# Patient Record
Sex: Male | Born: 1965 | Race: White | Hispanic: No | Marital: Single | Smoking: Current some day smoker
Health system: Southern US, Community
[De-identification: ages and names within clinical notes are randomized; demographics above are authoritative.]

## PROBLEM LIST (undated history)

## (undated) DIAGNOSIS — G8929 Other chronic pain: Secondary | ICD-10-CM

---

## 2004-08-15 ENCOUNTER — Emergency Department (HOSPITAL_COMMUNITY): Admission: EM | Admit: 2004-08-15 | Discharge: 2004-08-15 | Payer: Self-pay | Admitting: Emergency Medicine

## 2005-04-16 ENCOUNTER — Emergency Department: Payer: Self-pay | Admitting: Emergency Medicine

## 2005-04-20 ENCOUNTER — Emergency Department: Payer: Self-pay | Admitting: Emergency Medicine

## 2005-05-03 ENCOUNTER — Ambulatory Visit: Payer: Self-pay | Admitting: Family Medicine

## 2005-07-13 ENCOUNTER — Emergency Department: Payer: Self-pay | Admitting: General Practice

## 2005-12-15 ENCOUNTER — Emergency Department: Payer: Self-pay | Admitting: Emergency Medicine

## 2006-08-16 ENCOUNTER — Emergency Department: Payer: Self-pay | Admitting: Internal Medicine

## 2006-08-16 ENCOUNTER — Other Ambulatory Visit: Payer: Self-pay

## 2006-09-08 ENCOUNTER — Emergency Department: Payer: Self-pay | Admitting: Emergency Medicine

## 2006-09-09 ENCOUNTER — Emergency Department: Payer: Self-pay | Admitting: Emergency Medicine

## 2007-05-14 ENCOUNTER — Emergency Department: Payer: Self-pay | Admitting: Emergency Medicine

## 2007-05-20 ENCOUNTER — Emergency Department: Payer: Self-pay | Admitting: Internal Medicine

## 2007-07-23 ENCOUNTER — Inpatient Hospital Stay: Payer: Self-pay | Admitting: Surgery

## 2007-12-15 ENCOUNTER — Emergency Department: Payer: Self-pay | Admitting: Emergency Medicine

## 2007-12-22 ENCOUNTER — Emergency Department: Payer: Self-pay | Admitting: Emergency Medicine

## 2007-12-24 ENCOUNTER — Emergency Department: Payer: Self-pay | Admitting: Emergency Medicine

## 2009-01-08 ENCOUNTER — Emergency Department: Payer: Self-pay | Admitting: Emergency Medicine

## 2009-02-10 ENCOUNTER — Emergency Department: Payer: Self-pay | Admitting: Emergency Medicine

## 2009-10-05 ENCOUNTER — Emergency Department: Payer: Self-pay | Admitting: Emergency Medicine

## 2010-04-16 ENCOUNTER — Emergency Department: Payer: Self-pay | Admitting: Emergency Medicine

## 2011-04-05 ENCOUNTER — Emergency Department: Payer: Self-pay | Admitting: Internal Medicine

## 2011-11-19 ENCOUNTER — Emergency Department: Payer: Self-pay | Admitting: *Deleted

## 2012-05-02 ENCOUNTER — Other Ambulatory Visit: Payer: Self-pay

## 2013-03-05 ENCOUNTER — Emergency Department: Payer: Self-pay | Admitting: Emergency Medicine

## 2013-03-05 LAB — DRUG SCREEN, URINE

## 2013-03-05 LAB — COMPREHENSIVE METABOLIC PANEL
Albumin: 3.4 g/dL (ref 3.4–5.0)
Anion Gap: 4 — ABNORMAL LOW (ref 7–16)
Bilirubin,Total: 0.5 mg/dL (ref 0.2–1.0)
Calcium, Total: 8.9 mg/dL (ref 8.5–10.1)
Creatinine: 0.83 mg/dL (ref 0.60–1.30)
EGFR (African American): >60
EGFR (Non-African Amer.): >60
Osmolality: 273 (ref 275–301)
SGOT(AST): 39 U/L — ABNORMAL HIGH (ref 15–37)
SGPT (ALT): 49 U/L (ref 12–78)
Sodium: 136 mmol/L (ref 136–145)
Total Protein: 7.6 g/dL (ref 6.4–8.2)

## 2013-03-05 LAB — URINALYSIS, COMPLETE
Bacteria: NEGATIVE
Bilirubin,UR: NEGATIVE
Blood: NEGATIVE
Glucose,UR: NEGATIVE mg/dL (ref 0–75)
Ketone: NEGATIVE
Leukocyte Esterase: NEGATIVE
Nitrite: NEGATIVE
Ph: 8 (ref 4.5–8.0)
Protein: NEGATIVE
Specific Gravity: 1.01 (ref 1.003–1.030)
WBC UR: NONE SEEN /HPF (ref 0–5)

## 2013-03-05 LAB — CBC
HCT: 44.1 % (ref 40.0–52.0)
HGB: 14.9 g/dL (ref 13.0–18.0)
MCH: 30.1 pg (ref 26.0–34.0)
MCHC: 33.7 g/dL (ref 32.0–36.0)
MCV: 89 fL (ref 80–100)
Platelet: 259 10*3/uL (ref 150–440)
RBC: 4.94 10*6/uL (ref 4.40–5.90)
RDW: 13.2 % (ref 11.5–14.5)
WBC: 10 10*3/uL (ref 3.8–10.6)

## 2013-03-05 LAB — ETHANOL
Ethanol %: <0.003 % (ref 0.000–0.080)
Ethanol: <3 mg/dL

## 2013-03-05 LAB — ACETAMINOPHEN LEVEL: Acetaminophen: <2 ug/mL

## 2013-03-05 LAB — TSH: Thyroid Stimulating Horm: 0.62 u[IU]/mL

## 2013-03-05 LAB — SALICYLATE LEVEL: Salicylates, Serum: 2.8 mg/dL

## 2013-03-06 ENCOUNTER — Inpatient Hospital Stay: Payer: Self-pay | Admitting: Internal Medicine

## 2013-03-06 LAB — CBC
HCT: 42.2 % (ref 40.0–52.0)
Platelet: 279 10*3/uL (ref 150–440)
RBC: 4.76 10*6/uL (ref 4.40–5.90)
WBC: 9.1 10*3/uL (ref 3.8–10.6)

## 2013-03-06 LAB — COMPREHENSIVE METABOLIC PANEL
Anion Gap: 6 — ABNORMAL LOW (ref 7–16)
BUN: 13 mg/dL (ref 7–18)
Calcium, Total: 8.8 mg/dL (ref 8.5–10.1)
Creatinine: 0.94 mg/dL (ref 0.60–1.30)
EGFR (African American): >60
Glucose: 130 mg/dL — ABNORMAL HIGH (ref 65–99)
SGOT(AST): 39 U/L — ABNORMAL HIGH (ref 15–37)
SGPT (ALT): 50 U/L (ref 12–78)
Sodium: 138 mmol/L (ref 136–145)
Total Protein: 7.9 g/dL (ref 6.4–8.2)

## 2013-03-06 LAB — URINALYSIS, COMPLETE
Bilirubin,UR: NEGATIVE
Glucose,UR: NEGATIVE mg/dL (ref 0–75)
Ph: 6 (ref 4.5–8.0)
Protein: NEGATIVE
RBC,UR: 1 /HPF (ref 0–5)

## 2013-03-06 LAB — DRUG SCREEN, URINE
Amphetamines, Ur Screen: NEGATIVE (ref ?–1000)
Barbiturates, Ur Screen: NEGATIVE (ref ?–200)
Methadone, Ur Screen: NEGATIVE (ref ?–300)
Opiate, Ur Screen: POSITIVE (ref ?–300)

## 2013-03-06 LAB — ETHANOL: Ethanol: <3 mg/dL

## 2013-03-07 LAB — BASIC METABOLIC PANEL
Anion Gap: 8 (ref 7–16)
Chloride: 106 mmol/L (ref 98–107)
EGFR (Non-African Amer.): >60
Potassium: 3.6 mmol/L (ref 3.5–5.1)
Sodium: 138 mmol/L (ref 136–145)

## 2013-03-07 LAB — CBC WITH DIFFERENTIAL/PLATELET
Eosinophil #: 0.2 10*3/uL (ref 0.0–0.7)
Eosinophil %: 1.6 %
HGB: 14.7 g/dL (ref 13.0–18.0)
Lymphocyte #: 1.9 10*3/uL (ref 1.0–3.6)
Lymphocyte %: 16.1 %
MCH: 30.1 pg (ref 26.0–34.0)
MCHC: 34.2 g/dL (ref 32.0–36.0)
MCV: 88 fL (ref 80–100)
Monocyte %: 7.9 %
Neutrophil #: 8.8 10*3/uL — ABNORMAL HIGH (ref 1.4–6.5)
Neutrophil %: 74 %
Platelet: 271 10*3/uL (ref 150–440)
RDW: 13.3 % (ref 11.5–14.5)
WBC: 11.8 10*3/uL — ABNORMAL HIGH (ref 3.8–10.6)

## 2013-03-07 LAB — HEMOGLOBIN A1C: Hemoglobin A1C: 5.7 % (ref 4.2–6.3)

## 2013-03-09 LAB — COMPREHENSIVE METABOLIC PANEL
Albumin: 3.3 g/dL — ABNORMAL LOW (ref 3.4–5.0)
Alkaline Phosphatase: 87 U/L (ref 50–136)
Anion Gap: 9 (ref 7–16)
BUN: 10 mg/dL (ref 7–18)
Bilirubin,Total: 0.5 mg/dL (ref 0.2–1.0)
Calcium, Total: 8.6 mg/dL (ref 8.5–10.1)
Creatinine: 1.01 mg/dL (ref 0.60–1.30)
Glucose: 133 mg/dL — ABNORMAL HIGH (ref 65–99)
Potassium: 3.1 mmol/L — ABNORMAL LOW (ref 3.5–5.1)
SGOT(AST): 28 U/L (ref 15–37)
Total Protein: 7.6 g/dL (ref 6.4–8.2)

## 2013-06-23 ENCOUNTER — Other Ambulatory Visit: Payer: Self-pay | Admitting: *Deleted

## 2013-06-23 NOTE — Telephone Encounter (Signed)
Encounter opened in error

## 2013-08-31 IMAGING — CT CT HEAD WITHOUT CONTRAST
2 series · 15 of 30 positions shown, 19 images · non-contrast
Comparison: none

REASON FOR EXAM: fall down steps, +etoh
COMMENTS:

[Series 2: without · axial · non-contrast · 0.44mm/px · z∈[+214,+354]mm · 13 of 34 slices shown, 17 images]
[im 3/34  brain]
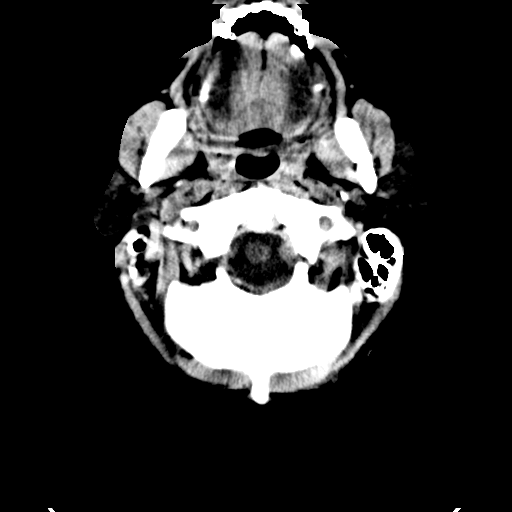
[im 3/34  bone]
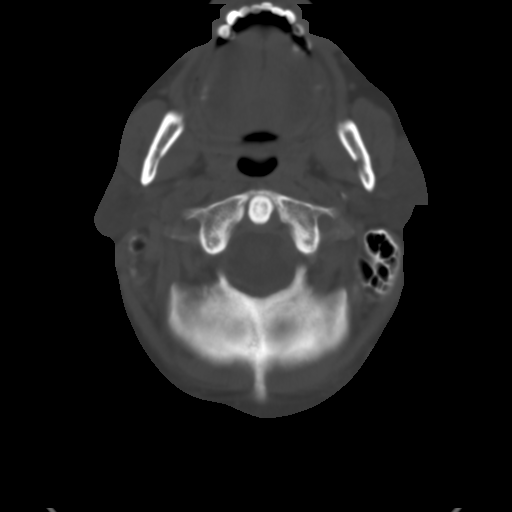
[im 5/34  brain]
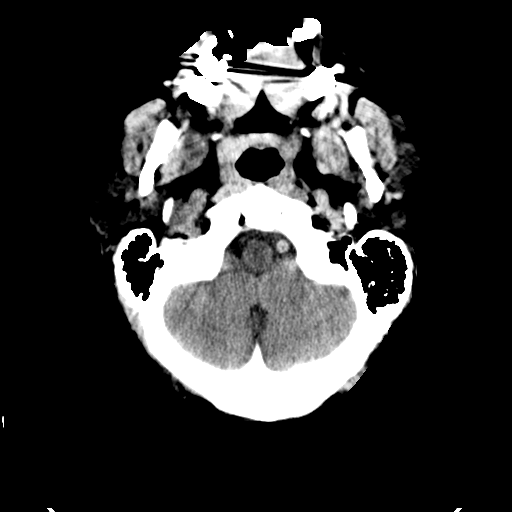
[im 8/34  brain]
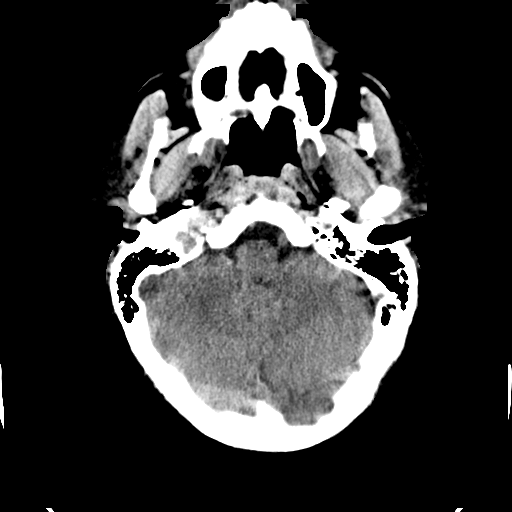
[im 10/34  brain]
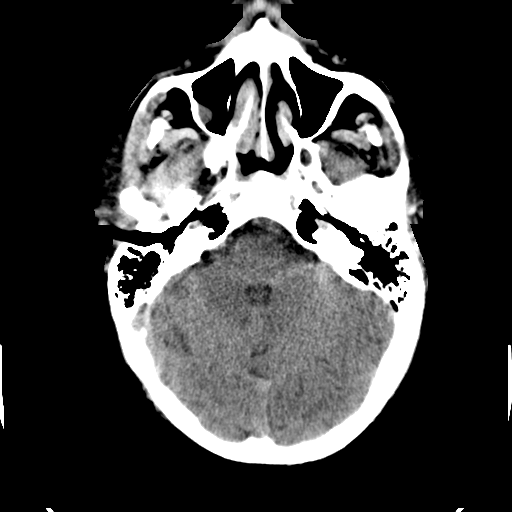
[im 12/34  brain]
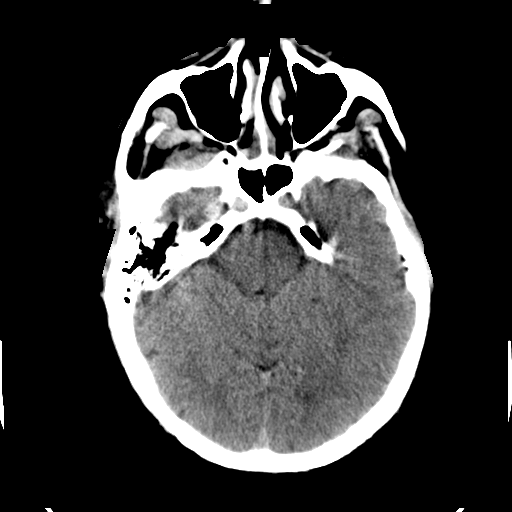
[im 12/34  bone]
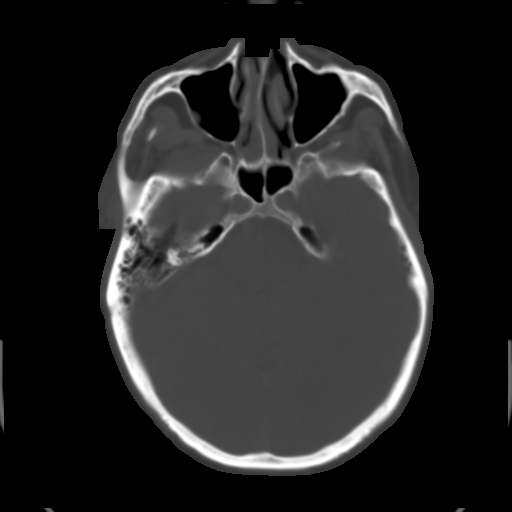
[im 15/34  brain]
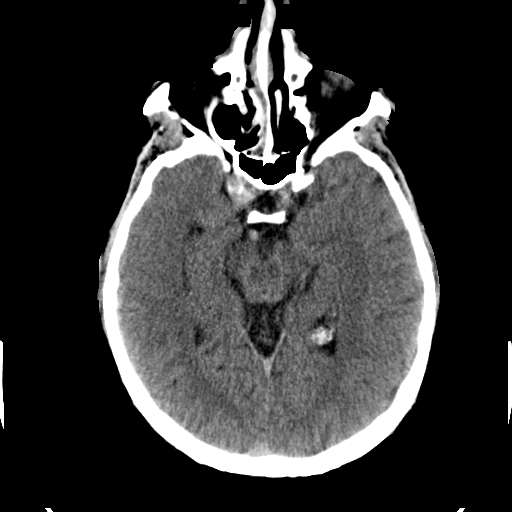
[im 17/34  brain]
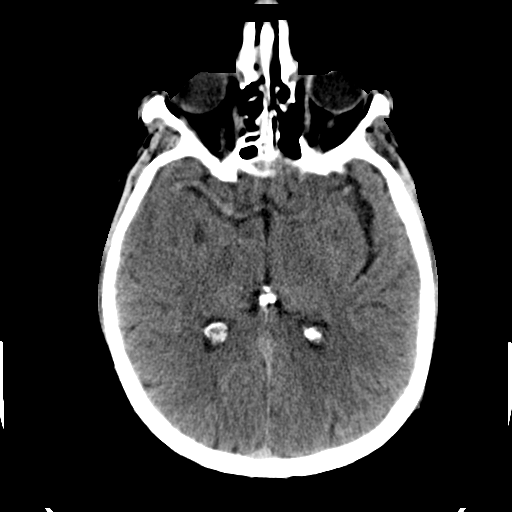
[im 19/34  brain]
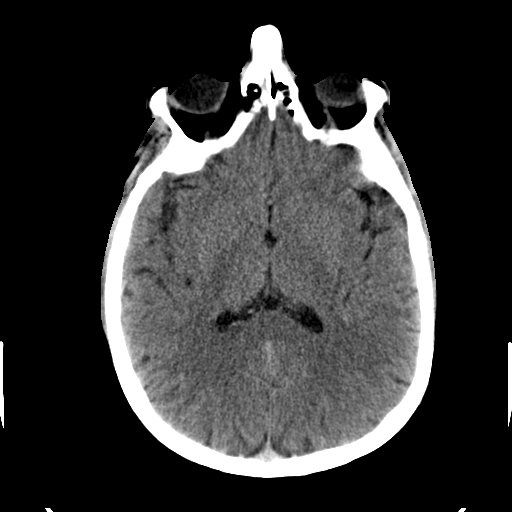
[im 22/34  brain]
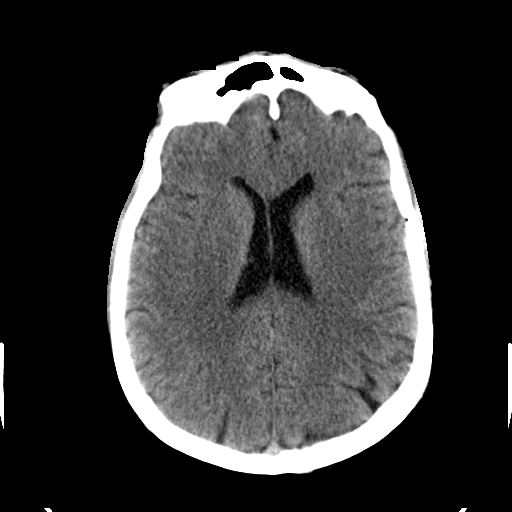
[im 22/34  bone]
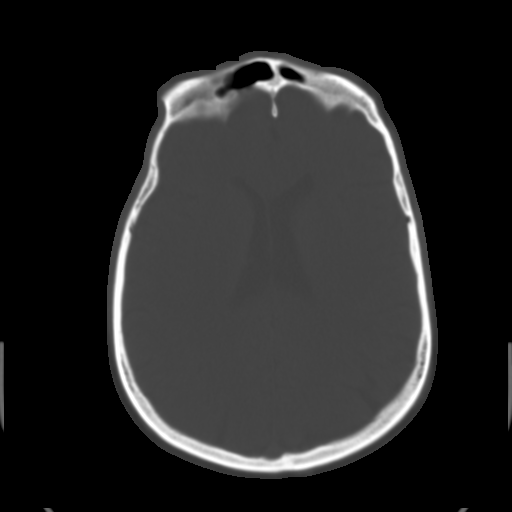
[im 24/34  brain]
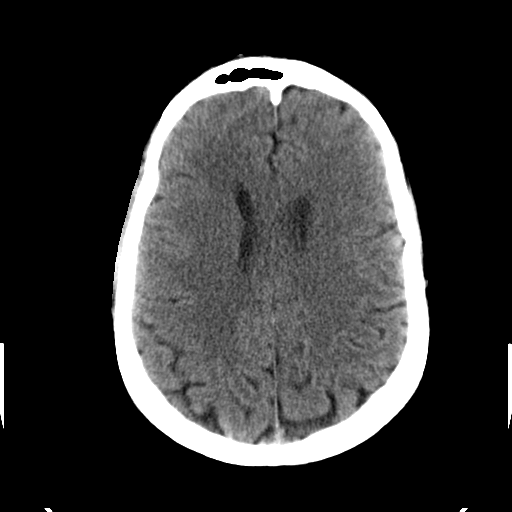
[im 26/34  brain]
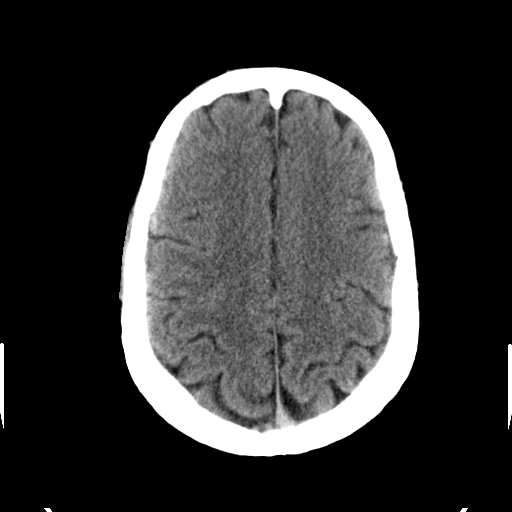
[im 29/34  brain]
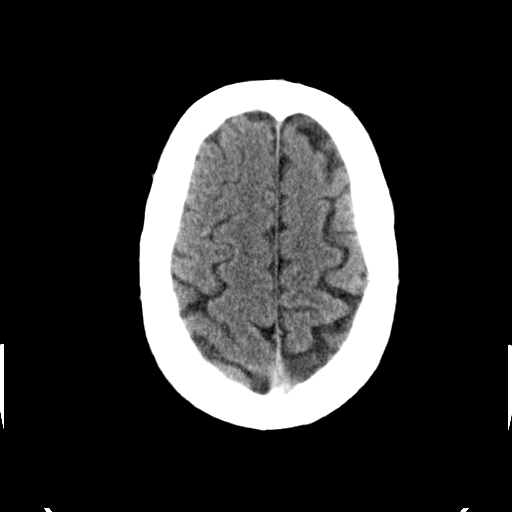
[im 31/34  brain]
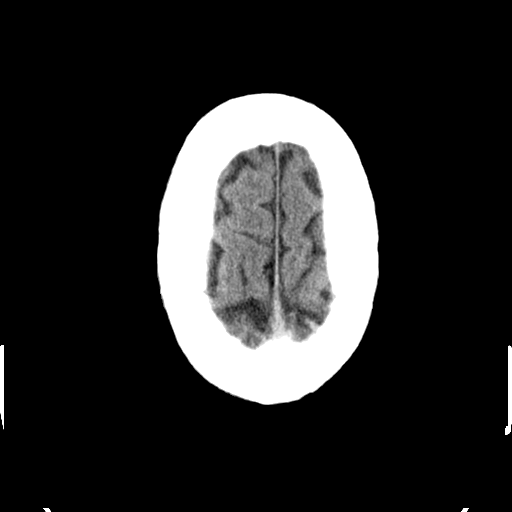
[im 31/34  bone]
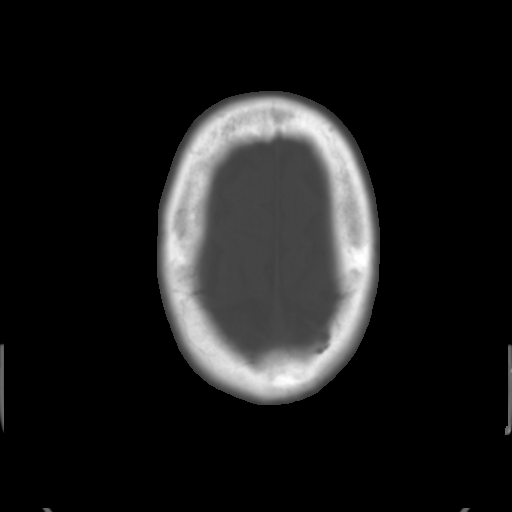

[Series 3: bone · axial · 0.44mm/px · z∈[+214,+240]mm · 2 of 35 slices shown]
[im 3/35  bone]
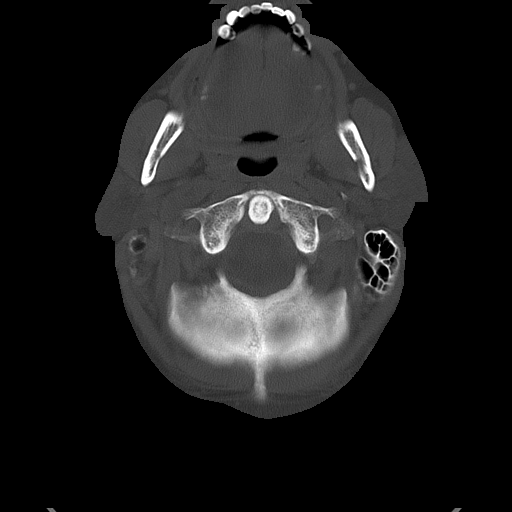
[im 8/35  bone]
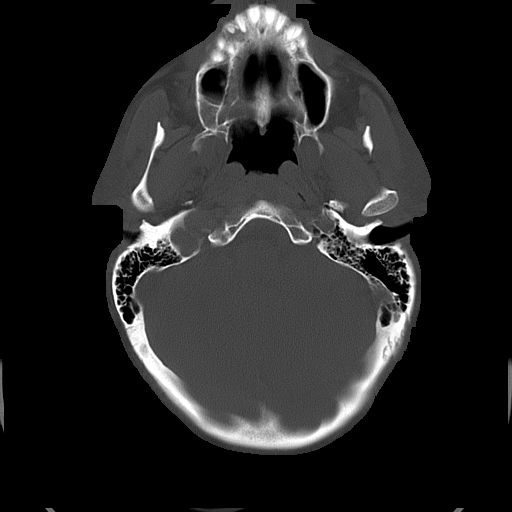

[15 of 30 positions shown; findings below may reference images not displayed]

PROCEDURE:     CT  - CT HEAD WITHOUT CONTRAST  - November 19, 2011 [DATE]

RESULT:     Axial noncontrast CT scanning was performed through the brain
with reconstructions at 5 mm intervals and slice thicknesses.

The ventricles are normal in size and position. There is an approximately 6
by 8mm diameter hypodensity in the be in Ceejay cortex on the right. There is
no intracranial hemorrhage nor intracranial mass effect. The cerebellum and
brainstem are normal in density. At bone window settings there is likely a
retention cyst or polyp in the right maxillary sinus. There is
mucoperiosteal thickening within the ethmoid sinus cells. There is no
evidence of an acute skull fracture.
IMPRESSION: 1. There is no evidence of an acute intracranial hemorrhage nor intracranial
mass effect. There is no evidence of an acute skull fracture.
2. Hypodensity in the basal ganglia on the right could reflect an old
lacunar infarction or a prominent CSF space. I do not see evidence of mass
effect or intracranial hemorrhage. This could be evaluated further with
elective MRI.
3. There is no hydrocephalus.

A preliminary report was sent to the [HOSPITAL] the conclusion
of the study.

## 2013-11-04 ENCOUNTER — Emergency Department: Payer: Self-pay | Admitting: Emergency Medicine

## 2013-11-04 LAB — COMPREHENSIVE METABOLIC PANEL
ALT: 43 U/L (ref 12–78)
ANION GAP: 3 — AB (ref 7–16)
Albumin: 3.6 g/dL (ref 3.4–5.0)
Alkaline Phosphatase: 91 U/L
BILIRUBIN TOTAL: 0.5 mg/dL (ref 0.2–1.0)
BUN: 15 mg/dL (ref 7–18)
CHLORIDE: 100 mmol/L (ref 98–107)
CREATININE: 1 mg/dL (ref 0.60–1.30)
Calcium, Total: 8.8 mg/dL (ref 8.5–10.1)
Co2: 32 mmol/L (ref 21–32)
EGFR (African American): >60
EGFR (Non-African Amer.): >60
GLUCOSE: 115 mg/dL — AB (ref 65–99)
Osmolality: 272 (ref 275–301)
Potassium: 3.9 mmol/L (ref 3.5–5.1)
SGOT(AST): 28 U/L (ref 15–37)
SODIUM: 135 mmol/L — AB (ref 136–145)
Total Protein: 8.3 g/dL — ABNORMAL HIGH (ref 6.4–8.2)

## 2013-11-04 LAB — URINALYSIS, COMPLETE
BILIRUBIN, UR: NEGATIVE
Bacteria: NONE SEEN
GLUCOSE, UR: NEGATIVE mg/dL (ref 0–75)
Ketone: NEGATIVE
LEUKOCYTE ESTERASE: NEGATIVE
NITRITE: NEGATIVE
Ph: 6 (ref 4.5–8.0)
Protein: NEGATIVE
RBC,UR: 6 /HPF (ref 0–5)
SPECIFIC GRAVITY: 1.018 (ref 1.003–1.030)
SQUAMOUS EPITHELIAL: NONE SEEN
WBC UR: NONE SEEN /HPF (ref 0–5)

## 2013-11-04 LAB — CBC
HCT: 39.1 % — AB (ref 40.0–52.0)
HGB: 13.5 g/dL (ref 13.0–18.0)
MCH: 30.7 pg (ref 26.0–34.0)
MCHC: 34.5 g/dL (ref 32.0–36.0)
MCV: 89 fL (ref 80–100)
Platelet: 248 10*3/uL (ref 150–440)
RBC: 4.4 10*6/uL (ref 4.40–5.90)
RDW: 12.9 % (ref 11.5–14.5)
WBC: 9.3 10*3/uL (ref 3.8–10.6)

## 2013-11-04 LAB — DRUG SCREEN, URINE
Amphetamines, Ur Screen: NEGATIVE (ref ?–1000)
BARBITURATES, UR SCREEN: POSITIVE (ref ?–200)
BENZODIAZEPINE, UR SCRN: NEGATIVE (ref ?–200)
Cannabinoid 50 Ng, Ur ~~LOC~~: NEGATIVE (ref ?–50)
Cocaine Metabolite,Ur ~~LOC~~: POSITIVE (ref ?–300)
MDMA (ECSTASY) UR SCREEN: NEGATIVE (ref ?–500)
METHADONE, UR SCREEN: NEGATIVE (ref ?–300)
OPIATE, UR SCREEN: POSITIVE (ref ?–300)
PHENCYCLIDINE (PCP) UR S: NEGATIVE (ref ?–25)
Tricyclic, Ur Screen: NEGATIVE (ref ?–1000)

## 2013-11-04 LAB — ETHANOL: Ethanol %: <0.003 % (ref 0.000–0.080)

## 2013-11-04 LAB — SALICYLATE LEVEL: SALICYLATES, SERUM: 3.1 mg/dL — AB

## 2013-11-04 LAB — ACETAMINOPHEN LEVEL: Acetaminophen: <2 ug/mL

## 2014-01-04 ENCOUNTER — Emergency Department: Payer: Self-pay | Admitting: Emergency Medicine

## 2014-01-04 LAB — COMPREHENSIVE METABOLIC PANEL
ANION GAP: 4 — AB (ref 7–16)
AST: 37 U/L (ref 15–37)
Albumin: 3.6 g/dL (ref 3.4–5.0)
Alkaline Phosphatase: 89 U/L
BILIRUBIN TOTAL: 0.3 mg/dL (ref 0.2–1.0)
BUN: 17 mg/dL (ref 7–18)
CALCIUM: 9 mg/dL (ref 8.5–10.1)
CREATININE: 1.06 mg/dL (ref 0.60–1.30)
Chloride: 102 mmol/L (ref 98–107)
Co2: 29 mmol/L (ref 21–32)
EGFR (African American): >60
GLUCOSE: 102 mg/dL — AB (ref 65–99)
OSMOLALITY: 272 (ref 275–301)
Potassium: 4.6 mmol/L (ref 3.5–5.1)
SGPT (ALT): 47 U/L (ref 12–78)
SODIUM: 135 mmol/L — AB (ref 136–145)
TOTAL PROTEIN: 8.5 g/dL — AB (ref 6.4–8.2)

## 2014-01-04 LAB — URINALYSIS, COMPLETE
Bacteria: NONE SEEN
Bilirubin,UR: NEGATIVE
Blood: NEGATIVE
GLUCOSE, UR: NEGATIVE mg/dL (ref 0–75)
KETONE: NEGATIVE
LEUKOCYTE ESTERASE: NEGATIVE
Nitrite: NEGATIVE
PH: 5 (ref 4.5–8.0)
Protein: NEGATIVE
Specific Gravity: 1.015 (ref 1.003–1.030)
WBC UR: <1 /HPF (ref 0–5)

## 2014-01-04 LAB — DRUG SCREEN, URINE
AMPHETAMINES, UR SCREEN: NEGATIVE (ref ?–1000)
Barbiturates, Ur Screen: NEGATIVE (ref ?–200)
Benzodiazepine, Ur Scrn: NEGATIVE (ref ?–200)
Cannabinoid 50 Ng, Ur ~~LOC~~: NEGATIVE (ref ?–50)
Cocaine Metabolite,Ur ~~LOC~~: NEGATIVE (ref ?–300)
MDMA (Ecstasy)Ur Screen: NEGATIVE (ref ?–500)
Methadone, Ur Screen: NEGATIVE (ref ?–300)
Opiate, Ur Screen: POSITIVE (ref ?–300)
Phencyclidine (PCP) Ur S: NEGATIVE (ref ?–25)
Tricyclic, Ur Screen: NEGATIVE (ref ?–1000)

## 2014-01-04 LAB — CBC
HCT: 40.9 % (ref 40.0–52.0)
HGB: 13.3 g/dL (ref 13.0–18.0)
MCH: 29.1 pg (ref 26.0–34.0)
MCHC: 32.5 g/dL (ref 32.0–36.0)
MCV: 89 fL (ref 80–100)
Platelet: 331 10*3/uL (ref 150–440)
RBC: 4.57 10*6/uL (ref 4.40–5.90)
RDW: 13.3 % (ref 11.5–14.5)
WBC: 7.8 10*3/uL (ref 3.8–10.6)

## 2014-01-04 LAB — ETHANOL

## 2014-01-04 LAB — ACETAMINOPHEN LEVEL

## 2014-01-04 LAB — SALICYLATE LEVEL: Salicylates, Serum: 2.8 mg/dL

## 2014-02-03 ENCOUNTER — Emergency Department: Payer: Self-pay | Admitting: Emergency Medicine

## 2014-02-03 LAB — COMPREHENSIVE METABOLIC PANEL
ALBUMIN: 3.5 g/dL (ref 3.4–5.0)
AST: 41 U/L — AB (ref 15–37)
Alkaline Phosphatase: 94 U/L
Anion Gap: 10 (ref 7–16)
BILIRUBIN TOTAL: 0.6 mg/dL (ref 0.2–1.0)
BUN: 12 mg/dL (ref 7–18)
CALCIUM: 8.4 mg/dL — AB (ref 8.5–10.1)
CO2: 25 mmol/L (ref 21–32)
Chloride: 105 mmol/L (ref 98–107)
Creatinine: 0.92 mg/dL (ref 0.60–1.30)
EGFR (African American): >60
EGFR (Non-African Amer.): >60
Glucose: 107 mg/dL — ABNORMAL HIGH (ref 65–99)
Osmolality: 280 (ref 275–301)
POTASSIUM: 4.3 mmol/L (ref 3.5–5.1)
SGPT (ALT): 53 U/L
SODIUM: 140 mmol/L (ref 136–145)
TOTAL PROTEIN: 8.1 g/dL (ref 6.4–8.2)

## 2014-02-03 LAB — DRUG SCREEN, URINE
Amphetamines, Ur Screen: NEGATIVE (ref ?–1000)
BARBITURATES, UR SCREEN: NEGATIVE (ref ?–200)
BENZODIAZEPINE, UR SCRN: NEGATIVE (ref ?–200)
CANNABINOID 50 NG, UR ~~LOC~~: POSITIVE (ref ?–50)
COCAINE METABOLITE, UR ~~LOC~~: NEGATIVE (ref ?–300)
MDMA (ECSTASY) UR SCREEN: NEGATIVE (ref ?–500)
METHADONE, UR SCREEN: NEGATIVE (ref ?–300)
OPIATE, UR SCREEN: POSITIVE (ref ?–300)
PHENCYCLIDINE (PCP) UR S: NEGATIVE (ref ?–25)
TRICYCLIC, UR SCREEN: NEGATIVE (ref ?–1000)

## 2014-02-03 LAB — CBC
HCT: 42.7 % (ref 40.0–52.0)
HGB: 14.6 g/dL (ref 13.0–18.0)
MCH: 29.6 pg (ref 26.0–34.0)
MCHC: 34.1 g/dL (ref 32.0–36.0)
MCV: 87 fL (ref 80–100)
Platelet: 278 10*3/uL (ref 150–440)
RBC: 4.92 10*6/uL (ref 4.40–5.90)
RDW: 13 % (ref 11.5–14.5)
WBC: 9.2 10*3/uL (ref 3.8–10.6)

## 2014-02-03 LAB — URINALYSIS, COMPLETE
BACTERIA: NONE SEEN
BILIRUBIN, UR: NEGATIVE
Blood: NEGATIVE
GLUCOSE, UR: NEGATIVE mg/dL (ref 0–75)
Ketone: NEGATIVE
LEUKOCYTE ESTERASE: NEGATIVE
NITRITE: NEGATIVE
PH: 8 (ref 4.5–8.0)
Protein: NEGATIVE
Specific Gravity: 1.016 (ref 1.003–1.030)
Squamous Epithelial: NONE SEEN
WBC UR: <1 /HPF (ref 0–5)

## 2014-02-03 LAB — ETHANOL
Ethanol %: <0.003 % (ref 0.000–0.080)
Ethanol: <3 mg/dL

## 2014-10-09 NOTE — Discharge Summary (Signed)
PATIENT NAME:  Chris Briggs, Deveion P MR#:  [id number] DATE OF BIRTH:  1966-01-17  DATE OF ADMISSION:  03/06/2013 DATE OF DISCHARGE:  03/09/2013  DISCHARGE DIAGNOSES: 1.  Delirium tremens, secondary to alcohol abuse.  2.  Alcohol intoxication.  3.  Hypokalemia.  4.  Depression. 5.  Chronic back pain.   CONSULTATION: Psychiatric consultation with Dr. Jennet MaduroPucilowska.  HOSPITAL COURSE: This is a 49 year old male with a history of alcohol abuse, came in because of detox from alcohol. The patient initially was seen on the behavioral side of the ER, received multiple doses of Ativan. Still was very agitated and became very restless. Admitted to medicine for DTs. Admitted to the Intensive Care Unit, started on a Precedex drip.   The patient's lab data was not significant on admission except AST was slightly up. The patient continued on the CIWA protocol and continued on the Precedex drip, monitored in the ICU for  seizures and other symptoms like respiratory depression.   The patient was seen by psychiatry, Dr. Jennet MaduroPucilowska. She recommended continuing CIWA and the patient was not referred to a psych bed because of limited bed availability. The patient came off the DTs and the Precedex drip since yesterday morning, and the patient is awake, alert, oriented. Denies any hallucinations or delusions. No shaking. He does admit that he was taking a fentanyl patch and also Soma for his back pain for a long time, and recently he did not get it for the last 2 months due to lack of insurance. Because of that he started to drink again. The patient says that he will not drink again and is asking me to give him a Duragesic  patch for a few days until he gets it from his primary doctor.   The takes [place]Soma and a Duragesic patch at home, and he takes he takes Soma 50 mg daily at night and Duragesic patch 75 mcg patch every 10 days.   The patient also goes to substance abuse counseling once or twice a week.   Right now he is alert,  awake, oriented. Denies any complaints. Did not take any Haldol or Ativan since yesterday, morning and vitals this morning are a blood pressure of 120/99, pulse is 106, respirations are 18, temperature is 98.4, heart rate was around 70's the whole time yesterday. The patient has a low potassium of 3.1 on this morning's labs. He is going to get 40 of p.o. potassium 40 mEq, and other labs were normal. His LFTs were also normal this morning, ALT 37, AST  28, alkaline phosphatase 87. The patient is stable for his depression. He can continue trazodone. The patient is going home to follow with Dr. Thana AtesMorrisey.   DISCHARGE MEDICATIONS: Ibuprofen 200 mg daily, thiamine 100 mg p.o. daily, folic acid 1 mg p.o. daily, amantadine 100 mg every 12 hours as needed, trazodone 50 mg 1 to 2 tablets daily, Zyprexa 1 tablet every 12 hours as needed, Soma 1 tablet at night as needed for some pains and also back pain, Duragesic patch 72 mcg patch every 72 hours; I only wrote for 3 patches. I only wrote for 5 tablets for Soma.   Time spent on discharge preparation: More than 30 minutes.     ____________________________ Katha HammingSnehalatha Cutberto Winfree, MD sk:dm D: 03/09/2013 08:03:41 ET T: 03/09/2013 09:28:03 ET JOB#: [id number]  cc: Katha HammingSnehalatha Zuriel Yeaman, MD, <Dictator> Dennison MascotLemont Morrisey, MD Katha HammingSNEHALATHA Rayya Yagi MD ELECTRONICALLY SIGNED 03/22/2013 23:05

## 2014-10-09 NOTE — Consult Note (Signed)
Brief Consult Note: Diagnosis: Alcohol withdrawal delirium.   Patient was seen by consultant.   Recommend further assessment or treatment.   Comments: Mr. Chris Briggs is an alcoholic. He is in DTs. I was unable to communicate with him today. He is too sleepy and answers with nods only.  PLAN: 1. Please continue CIWA.   2. Katrinka Blazinghedre is no history of depression, suicidal or homicidal thoughts.   3. Given limited number of beds in psychiatry it is unlikely that the patient will be transferred to psychiatry. Please discharge from medical floor.  Electronic Signatures: Kristine LineaPucilowska, Cade Dashner (MD)  (Signed 19-Sep-14 13:13)  Authored: Brief Consult Note   Last Updated: 19-Sep-14 13:13 by Kristine LineaPucilowska, Kima Malenfant (MD)
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
## 2016-10-31 ENCOUNTER — Emergency Department (HOSPITAL_COMMUNITY)
Admission: EM | Admit: 2016-10-31 | Discharge: 2016-10-31 | Disposition: A | Discharge status: Home / Self Care | Payer: Self-pay | Attending: Emergency Medicine | Admitting: Emergency Medicine

## 2016-10-31 ENCOUNTER — Encounter (HOSPITAL_COMMUNITY): Payer: Self-pay | Admitting: Emergency Medicine

## 2016-10-31 DIAGNOSIS — M546 Pain in thoracic spine: Secondary | ICD-10-CM | POA: Insufficient documentation

## 2016-10-31 DIAGNOSIS — F17200 Nicotine dependence, unspecified, uncomplicated: Secondary | ICD-10-CM | POA: Insufficient documentation

## 2016-10-31 HISTORY — DX: Other chronic pain: G89.29

## 2016-10-31 MED ORDER — CARISOPRODOL 350 MG PO TABS: 350.0000 mg | ORAL_TABLET | Freq: Three times a day (TID) | ORAL | 0 refills | Status: AC | PRN

## 2016-10-31 NOTE — ED Provider Notes (Signed)
[facility]-EMERGENCY DEPT Provider Note   CSN: [id number] Arrival date & time: 10/31/16  1307   By signing my name below, I, Soijett Blue, attest that this documentation has been prepared under the direction and in the presence of Raeford Razor, MD. Electronically Signed: Soijett Blue, ED Scribe. 10/31/16. 2:38 PM.  History   Chief Complaint Chief Complaint  Patient presents with  . Back Pain    HPI Chris Briggs. is a 51 y.o. male who presents to the Emergency Department complaining of gradually worsening, back pain onset 3 weeks ago. Pt has not tried any medications for the relief of his symptoms. He notes that he will intermittently have spasms to his mid lateral back, lower back, and right calf. Pt reports that he used to have a PCP who would prescribe him Tresa Garter that alleviated his symptoms and he voices concern for a prescription at this time. He states that he has had a back surgery several years ago. Denies difficulty urinating, color change, wound, and any other symptoms.     The history is provided by the patient. No language interpreter was used.    Past Medical History:  Diagnosis Date  . Chronic pain     There are no active problems to display for this patient.   History reviewed. No pertinent surgical history.     Home Medications    Prior to Admission medications   Not on File    Family History History reviewed. No pertinent family history.  Social History Social History  Substance Use Topics  . Smoking status: Current Every Day Smoker  . Smokeless tobacco: Never Used  . Alcohol use No     Allergies   Ativan [lorazepam] and Penicillins   Review of Systems Review of Systems  Genitourinary: Negative for difficulty urinating.  Musculoskeletal: Positive for back pain.       +Spasms to right calf, lower back, and mid back.  Skin: Negative for color change and wound.  All other systems reviewed and are negative.    Physical Exam Updated Vital  Signs BP 122/85   Pulse 100   Temp 97.9 F (36.6 C) (Oral)   Resp 16   Ht 6' (1.829 m)   Wt 200 lb (90.7 kg)   SpO2 93%   BMI 27.12 kg/m   Physical Exam  Constitutional: He is oriented to person, place, and time. He appears well-developed and well-nourished.  HENT:  Head: Normocephalic and atraumatic.  Right Ear: External ear normal.  Left Ear: External ear normal.  Nose: Nose normal.  Eyes: Right eye exhibits no discharge. Left eye exhibits no discharge.  Neck: Neck supple.  Cardiovascular: Normal rate, regular rhythm and normal heart sounds.  Exam reveals no gallop and no friction rub.   No murmur heard. Pulmonary/Chest: Effort normal and breath sounds normal. No respiratory distress. He has no wheezes. He has no rales.  Abdominal: Soft. There is no tenderness.  Musculoskeletal: He exhibits no edema.  No midline spinal tenderness. Small midline lumbar surgical scar. Able to stand and sit without any apparent difficulty.   Neurological: He is alert and oriented to person, place, and time.  Skin: Skin is warm and dry.  Nursing note and vitals reviewed.    ED Treatments / Results  DIAGNOSTIC STUDIES: Oxygen Saturation is 93% on RA, nl by my interpretation.    COORDINATION OF CARE: 2:37 PM Discussed treatment plan with pt at bedside which includes soma Rx and pt agreed to plan.  Labs (all labs ordered are listed, but only abnormal results are displayed) Labs Reviewed - No data to display  EKG  EKG Interpretation None       Radiology No results found.  Procedures Procedures (including critical care time)  Medications Ordered in ED Medications - No data to display   Initial Impression / Assessment and Plan / ED Course  I have reviewed the triage vital signs and the nursing notes.  Pertinent labs & imaging results that were available during my care of the patient were reviewed by me and considered in my medical decision making (see chart for details).       50yM with with acute on chronic back pain. No red flags. Nonfocal neuro exam. Requesting soma since it has helped him previously. PCP FU.   Final Clinical Impressions(s) / ED Diagnoses   Final diagnoses:  Bilateral thoracic back pain, unspecified chronicity    New Prescriptions Discharge Medication List as of 10/31/2016  2:39 PM    START taking these medications   Details  carisoprodol (SOMA) 350 MG tablet Take 1 tablet (350 mg total) by mouth 3 (three) times daily as needed for muscle spasms., Starting Tue 10/31/2016, Print       I personally preformed the services scribed in my presence. The recorded information has been reviewed is accurate. Raeford RazorStephen Trust Leh, MD.     Raeford RazorKohut, Michaelann Gunnoe, MD 11/13/16 [phone]

## 2016-10-31 NOTE — ED Triage Notes (Signed)
Pt sts lower back pain that is chronic in nature

## 2016-10-31 NOTE — ED Notes (Signed)
Called patient for room with no answer. 

## 2017-11-26 ENCOUNTER — Other Ambulatory Visit
Admission: AD | Admit: 2017-11-26 | Discharge: 2017-11-26 | Disposition: A | Discharge status: Home / Self Care | Payer: Self-pay | Attending: Family Medicine | Admitting: Family Medicine

## 2018-04-27 ENCOUNTER — Other Ambulatory Visit: Payer: Self-pay

## 2018-04-27 ENCOUNTER — Encounter: Payer: Self-pay | Admitting: Emergency Medicine

## 2018-04-27 ENCOUNTER — Emergency Department: Payer: Self-pay

## 2018-04-27 ENCOUNTER — Emergency Department
Admission: EM | Admit: 2018-04-27 | Discharge: 2018-04-27 | Disposition: A | Discharge status: Home / Self Care | Payer: Self-pay | Attending: Emergency Medicine | Admitting: Emergency Medicine

## 2018-04-27 DIAGNOSIS — F17200 Nicotine dependence, unspecified, uncomplicated: Secondary | ICD-10-CM | POA: Insufficient documentation

## 2018-04-27 DIAGNOSIS — R030 Elevated blood-pressure reading, without diagnosis of hypertension: Secondary | ICD-10-CM | POA: Insufficient documentation

## 2018-04-27 DIAGNOSIS — R21 Rash and other nonspecific skin eruption: Secondary | ICD-10-CM | POA: Insufficient documentation

## 2018-04-27 DIAGNOSIS — R001 Bradycardia, unspecified: Secondary | ICD-10-CM | POA: Insufficient documentation

## 2018-04-27 LAB — CBC
HCT: 45.8 % (ref 39.0–52.0)
Hemoglobin: 15.1 g/dL (ref 13.0–17.0)
MCH: 27.9 pg (ref 26.0–34.0)
MCHC: 33 g/dL (ref 30.0–36.0)
MCV: 84.7 fL (ref 80.0–100.0)
PLATELETS: 317 10*3/uL (ref 150–400)
RBC: 5.41 MIL/uL (ref 4.22–5.81)
RDW: 14.1 % (ref 11.5–15.5)
WBC: 10.6 10*3/uL — AB (ref 4.0–10.5)
nRBC: 0 % (ref 0.0–0.2)

## 2018-04-27 LAB — COMPREHENSIVE METABOLIC PANEL
ALBUMIN: 4.2 g/dL (ref 3.5–5.0)
ALT: 42 U/L (ref 0–44)
ANION GAP: 8 (ref 5–15)
AST: 35 U/L (ref 15–41)
Alkaline Phosphatase: 68 U/L (ref 38–126)
BUN: 13 mg/dL (ref 6–20)
CHLORIDE: 104 mmol/L (ref 98–111)
CO2: 24 mmol/L (ref 22–32)
Calcium: 9.3 mg/dL (ref 8.9–10.3)
Creatinine, Ser: 0.75 mg/dL (ref 0.61–1.24)
GFR calc Af Amer: >60 mL/min (ref >60)
GFR calc non Af Amer: >60 mL/min (ref >60)
Glucose, Bld: 106 mg/dL — ABNORMAL HIGH (ref 70–99)
POTASSIUM: 4.3 mmol/L (ref 3.5–5.1)
Sodium: 136 mmol/L (ref 135–145)
TOTAL PROTEIN: 8.2 g/dL — AB (ref 6.5–8.1)
Total Bilirubin: 0.9 mg/dL (ref 0.3–1.2)

## 2018-04-27 LAB — TROPONIN I: Troponin I: <0.03 ng/mL (ref <0.03)

## 2018-04-27 LAB — TSH: TSH: 1.653 u[IU]/mL (ref 0.350–4.500)

## 2018-04-27 MED ORDER — BLOOD PRESSURE MONITOR DEVI: 1.0000 [IU] | Freq: Three times a day (TID) | 0 refills | Status: AC

## 2018-04-27 MED ORDER — PREDNISONE 10 MG PO TABS: ORAL_TABLET | ORAL | 0 refills | Status: DC

## 2018-04-27 MED ORDER — METHYLPREDNISOLONE SODIUM SUCC 125 MG IJ SOLR
125.0000 mg | Freq: Once | INTRAMUSCULAR | Status: AC
  Administered 2018-04-27: 125 mg via INTRAMUSCULAR
  Filled 2018-04-27: qty 2

## 2018-04-27 MED ORDER — HYDROCHLOROTHIAZIDE 12.5 MG PO CAPS: 12.5000 mg | ORAL_CAPSULE | Freq: Every day | ORAL | 0 refills | Status: DC

## 2018-04-27 NOTE — ED Triage Notes (Addendum)
Rash upper chest x 2 weeks, burns. States discontinued opiates 8 weeks ago.

## 2018-04-27 NOTE — ED Notes (Signed)
Attempted to draw lab work, unsuccessful attempt x 1, Theodoro Grist ED Charity fundraiser will come and look.

## 2018-04-27 NOTE — ED Provider Notes (Signed)
[facility] Emergency Department Provider Note  ____________________________________________  Time seen: Approximately 1:01 PM  I have reviewed the triage vital signs and the nursing notes.   HISTORY  Chief Complaint Rash    HPI Chris Briggs. is a 52 y.o. male presents emergency department for evaluation of rash to chest for 2 weeks.  Patient states that rash is irritating and somewhat itchy but not painful.  Rash is in the same area where he was wearing a necklace but has worn this necklace for years.  He has not been sick recently.  No insect bites.  No fever, chills.  Patient also states that he has felt more tired during the day and has had some body aches for the last 3 or 4 weeks.  He used to be on several opioid medications for chronic pain and discontinued them 8 weeks ago. He has been trying to exercise more recently and has been loosing weight. He drinks 2 large cups of coffee per day. He has also been drinking energy drinks. He has been told before that he has high blood pressure.  He remembers having a heart rate at 50 several years ago when he was in the hospital but has not had primary care for years for any recheck of blood pressure and HR. No headache, shortness of breath, chest pain.   Past Medical History:  Diagnosis Date  . Chronic pain     There are no active problems to display for this patient.   History reviewed. No pertinent surgical history.  Prior to Admission medications   Medication Sig Start Date End Date Taking? Authorizing Provider  carisoprodol (SOMA) 350 MG tablet Take 1 tablet (350 mg total) by mouth 3 (three) times daily as needed for muscle spasms. 10/31/16   Raeford Razor, MD  hydrochlorothiazide (MICROZIDE) 12.5 MG capsule Take 1 capsule (12.5 mg total) by mouth daily. 04/27/18 04/27/19  Enid Derry, PA-C  predniSONE (DELTASONE) 10 MG tablet Take 6 tablets on day 1, take 5 tablets on day 2, take 4 tablets on day 3,  take 3 tablets on day 4, take 2 tablets on day 5, take 1 tablet on day 6 04/27/18   Enid Derry, PA-C    Allergies Ativan [lorazepam]; Penicillins; and Phenergan [promethazine hcl]  No family history on file.  Social History Social History   Tobacco Use  . Smoking status: Current Every Day Smoker  . Smokeless tobacco: Never Used  Substance Use Topics  . Alcohol use: No  . Drug use: No     Review of Systems  Constitutional: No fever/chills Cardiovascular: No chest pain. Respiratory: No SOB. Gastrointestinal: No abdominal pain.  No nausea, no vomiting.  Musculoskeletal: Negative for musculoskeletal pain. Skin: Negative for abrasions, lacerations, ecchymosis. Positive for rash. Neurological: Negative for headaches, numbness or tingling   ____________________________________________   PHYSICAL EXAM:  VITAL SIGNS: ED Triage Vitals  Enc Vitals Group     BP 04/27/18 1043 (!) 154/107     Pulse Rate 04/27/18 1043 96     Resp 04/27/18 1043 20     Temp 04/27/18 1043 97.8 F (36.6 C)     Temp Source 04/27/18 1043 Oral     SpO2 04/27/18 1043 98 %     Weight 04/27/18 1044 210 lb (95.3 kg)     Height 04/27/18 1044 6' (1.829 m)     Head Circumference --      Peak Flow --      Pain Score --  Pain Loc --      Pain Edu? --      Excl. in GC? --      Constitutional: Alert and oriented. Well appearing and in no acute distress. Eyes: Conjunctivae are normal. PERRL. EOMI. Head: Atraumatic. ENT:      Ears:      Nose: No congestion/rhinnorhea.      Mouth/Throat: Mucous membranes are moist.  Neck: No stridor.  Cardiovascular: Normal rate, regular rhythm.  Good peripheral circulation. Respiratory: Normal respiratory effort without tachypnea or retractions. Lungs CTAB. Good air entry to the bases with no decreased or absent breath sounds. Musculoskeletal: Full range of motion to all extremities. No gross deformities appreciated. Neurologic:  Normal speech and language. No  gross focal neurologic deficits are appreciated.  Skin:  Skin is warm, dry and intact.  Large area of slightly red and dry skin to chest. Crosses the midline. No vesicles. Psychiatric: Mood and affect are normal. Speech and behavior are normal. Patient exhibits appropriate insight and judgement.   ____________________________________________   LABS (all labs ordered are listed, but only abnormal results are displayed)  Labs Reviewed  CBC - Abnormal; Notable for the following components:      Result Value   WBC 10.6 (*)    All other components within normal limits  COMPREHENSIVE METABOLIC PANEL - Abnormal; Notable for the following components:   Glucose, Bld 106 (*)    Total Protein 8.2 (*)    All other components within normal limits  TSH  TROPONIN I   ____________________________________________  EKG  NSR  ____________________________________________  RADIOLOGY Lexine Baton, personally viewed and evaluated these images (plain radiographs) as part of my medical decision making, as well as reviewing the written report by the radiologist.  Dg Chest 2 View  Result Date: 04/27/2018 CLINICAL DATA:  Hypertension with bradycardia. EXAM: CHEST - 2 VIEW COMPARISON:  11/19/2011 FINDINGS: The heart size and mediastinal contours are within normal limits. Both lungs are clear. The visualized skeletal structures are unremarkable. No pleural effusions. Trachea is midline. IMPRESSION: No active cardiopulmonary disease. Electronically Signed   By: Richarda Overlie M.D.   On: 04/27/2018 12:03    ____________________________________________    PROCEDURES  Procedure(s) performed:    Procedures    Medications  methylPREDNISolone sodium succinate (SOLU-MEDROL) 125 mg/2 mL injection 125 mg (has no administration in time range)     ____________________________________________   INITIAL IMPRESSION / ASSESSMENT AND PLAN / ED COURSE  Pertinent labs & imaging results that were  available during my care of the patient were reviewed by me and considered in my medical decision making (see chart for details).  Review of the [place] CSRS was performed in accordance of the [facility] prior to dispensing any controlled drugs.   Patient presents emergency department for evaluation of rash to chest for 2 weeks.  Exam is reassuring.  Rash she has appearance is consistent with a contact dermatitis.  Patient is very well-appearing and talkative.  He is up walking around the room.  Vital sign recheck showed HR 48 while in the emergency department, during which he was asymptomatic.  He has been told previously that his heart rate has been in the low 50s.  Troponin is negative.  Chest x-ray negative for acute cardia pulmonary processes.  EKG shows normal sinus rhythm.  Patient was given a prescription for a blood pressure monitor and will monitor his blood pressure and heart rate at home.  He was given resources for [facility]  to [facility] care.  He is also agreeable to call cardiology for an appointment.  Patient will be discharged home with prescriptions for prednisone for rash. Patient is to follow up with PCP, cardiology and dermatology as directed. Patient is given ED precautions to return to the ED for any worsening or new symptoms.     ____________________________________________  FINAL CLINICAL IMPRESSION(S) / ED DIAGNOSES  Final diagnoses:  Rash  Elevated blood pressure reading  Bradycardia      NEW MEDICATIONS STARTED DURING THIS VISIT:  ED Discharge Orders         Ordered    predniSONE (DELTASONE) 10 MG tablet     04/27/18 1255    hydrochlorothiazide (MICROZIDE) 12.5 MG capsule  Daily     04/27/18 1255              This chart was dictated using voice recognition software/Dragon. Despite best efforts to proofread, errors can occur which can change the meaning. Any change was purely unintentional.    Enid Derry, PA-C 04/27/18 1730    Pershing Proud  Myra Rude, MD 04/28/18 [phone]

## 2018-04-27 NOTE — Discharge Instructions (Signed)
You had a low heart rate readings while in the emergency department.  Your blood pressure was also mildly elevated.  Please make an appointment [facility] to [facility] care.  Also follow-up with cardiology.  Return to the emergency department immediately for worsening symptoms including headache, shortness of breath, chest pain, or any other symptoms concerning to you.

## 2018-04-27 NOTE — ED Notes (Addendum)
Pt to ed with c/o chest rash and burning x 2 weeks.  PA at bedside. Small red area noted to chest, slightly raised.  Pt states he noticed it after wearing a necklace.

## 2018-06-19 ENCOUNTER — Emergency Department
Admission: EM | Admit: 2018-06-19 | Discharge: 2018-06-19 | Disposition: A | Discharge status: Home / Self Care | Payer: Self-pay | Attending: Emergency Medicine | Admitting: Emergency Medicine

## 2018-06-19 ENCOUNTER — Other Ambulatory Visit: Payer: Self-pay

## 2018-06-19 ENCOUNTER — Encounter: Payer: Self-pay | Admitting: Emergency Medicine

## 2018-06-19 DIAGNOSIS — F17210 Nicotine dependence, cigarettes, uncomplicated: Secondary | ICD-10-CM | POA: Insufficient documentation

## 2018-06-19 DIAGNOSIS — L0100 Impetigo, unspecified: Secondary | ICD-10-CM | POA: Insufficient documentation

## 2018-06-19 DIAGNOSIS — L259 Unspecified contact dermatitis, unspecified cause: Secondary | ICD-10-CM | POA: Insufficient documentation

## 2018-06-19 MED ORDER — PREDNISONE 10 MG PO TABS: 10.0000 mg | ORAL_TABLET | Freq: Every day | ORAL | 0 refills | Status: AC

## 2018-06-19 MED ORDER — TRIAMCINOLONE ACETONIDE 0.1 % EX CREA: 1.0000 "application " | TOPICAL_CREAM | Freq: Four times a day (QID) | CUTANEOUS | 0 refills | Status: AC

## 2018-06-19 MED ORDER — CEPHALEXIN 500 MG PO CAPS: 500.0000 mg | ORAL_CAPSULE | Freq: Four times a day (QID) | ORAL | 0 refills | Status: AC

## 2018-06-19 MED ORDER — DEXAMETHASONE SODIUM PHOSPHATE 10 MG/ML IJ SOLN
10.0000 mg | Freq: Once | INTRAMUSCULAR | Status: AC
  Administered 2018-06-19: 10 mg via INTRAMUSCULAR
  Filled 2018-06-19: qty 1

## 2018-06-19 NOTE — ED Provider Notes (Signed)
[facility] Emergency Department Provider Note  ____________________________________________  Time seen: Approximately 4:37 PM  I have reviewed the triage vital signs and the nursing notes.   HISTORY  Chief Complaint Rash    HPI Chris Briggs. is a 53 y.o. male who presents the emergency department complaining of a rash to the anterior neck.  Patient was originally seen in this department roughly 3 weeks ago.  He was diagnosed with contact dermatitis after wearing a new chain.  Patient was given a shot of steroid, prescribed short steroid course.  Patient reports that he did not realize the medication had been sent to the pharmacy instead of a paper prescription.  Patient had been searching his house before finally returning to the emergency department and being told that the prescription had been sent in.  Patient reports that 5 to 7 days had elapsed prior to him returning.  He states that the shot had drastically reduced the symptoms but had not fully alleviated all the rash.  While trying to find a prescription, the rash had slowly worsened.  He then took a 6-day taper which he states almost alleviated the rash before it returned after finishing the steroid.  Patient is requesting another injection of steroid and another prescription for prednisone.  No new symptoms.  Patient denies any headache, chest pain, shortness of breath abdominal pain, nausea or vomiting.    Past Medical History:  Diagnosis Date  . Chronic pain     There are no active problems to display for this patient.   History reviewed. No pertinent surgical history.  Prior to Admission medications   Medication Sig Start Date End Date Taking? Authorizing Provider  Blood Pressure Monitor DEVI 1 Units by Does not apply route 3 (three) times daily. 04/27/18   Enid Derry, PA-C  carisoprodol (SOMA) 350 MG tablet Take 1 tablet (350 mg total) by mouth 3 (three) times daily as needed for muscle  spasms. 10/31/16   Raeford Razor, MD  cephALEXin (KEFLEX) 500 MG capsule Take 1 capsule (500 mg total) by mouth 4 (four) times daily. 06/19/18   Cuthriell, Delorise Royals, PA-C  predniSONE (DELTASONE) 10 MG tablet Take 1 tablet (10 mg total) by mouth daily. 06/19/18   Cuthriell, Delorise Royals, PA-C  triamcinolone cream (KENALOG) 0.1 % Apply 1 application topically 4 (four) times daily. 06/19/18   Cuthriell, Delorise Royals, PA-C    Allergies Ativan [lorazepam]; Penicillins; and Phenergan [promethazine hcl]  No family history on file.  Social History Social History   Tobacco Use  . Smoking status: Current Some Day Smoker    Types: Cigarettes  . Smokeless tobacco: Never Used  Substance Use Topics  . Alcohol use: No  . Drug use: No     Review of Systems  Constitutional: No fever/chills Eyes: No visual changes.  Cardiovascular: no chest pain. Respiratory: no cough. No SOB. Gastrointestinal: No abdominal pain.  No nausea, no vomiting.   Musculoskeletal: Negative for musculoskeletal pain. Skin: Positive for rash to the anterior neck and chest. Neurological: Negative for headaches, focal weakness or numbness. 10-point ROS otherwise negative.  ____________________________________________   PHYSICAL EXAM:  VITAL SIGNS: ED Triage Vitals  Enc Vitals Group     BP 06/19/18 1557 (!) 129/107     Pulse Rate 06/19/18 1557 (!) 111     Resp 06/19/18 1557 18     Temp 06/19/18 1557 98.3 F (36.8 C)     Temp Source 06/19/18 1557 Oral  SpO2 06/19/18 1557 98 %     Weight 06/19/18 1558 205 lb (93 kg)     Height 06/19/18 1558 5\' 11"  (1.803 m)     Head Circumference --      Peak Flow --      Pain Score 06/19/18 1557 0     Pain Loc --      Pain Edu? --      Excl. in GC? --      Constitutional: Alert and oriented. Well appearing and in no acute distress. Eyes: Conjunctivae are normal. PERRL. EOMI. Head: Atraumatic. Neck: No stridor.   Hematological/Lymphatic/Immunilogical: No cervical  lymphadenopathy. Cardiovascular: Normal rate, regular rhythm. Normal S1 and S2.  Good peripheral circulation. Respiratory: Normal respiratory effort without tachypnea or retractions. Lungs CTAB. Good air entry to the bases with no decreased or absent breath sounds. Musculoskeletal: Full range of motion to all extremities. No gross deformities appreciated. Neurologic:  Normal speech and language. No gross focal neurologic deficits are appreciated.  Skin:  Skin is warm, dry and intact. No rash noted.  Visualization of the anterior neck and chest reveals a papular rash consistent with contact dermatitis.  On the right side, there are findings of mild impetigo likely from scratching.  This revealed mild scattered honey colored crusted lesions intermixed with contact dermatitis.  No indication of abscess.  No vesicle formation. Psychiatric: Mood and affect are normal. Speech and behavior are normal. Patient exhibits appropriate insight and judgement.   ____________________________________________   LABS (all labs ordered are listed, but only abnormal results are displayed)  Labs Reviewed - No data to display ____________________________________________  EKG   ____________________________________________  RADIOLOGY   No results found.  ____________________________________________    PROCEDURES  Procedure(s) performed:    Procedures    Medications  dexamethasone (DECADRON) injection 10 mg (has no administration in time range)     ____________________________________________   INITIAL IMPRESSION / ASSESSMENT AND PLAN / ED COURSE  Pertinent labs & imaging results that were available during my care of the patient were reviewed by me and considered in my medical decision making (see chart for details).  Review of the [place] CSRS was performed in accordance of the [facility] prior to dispensing any controlled drugs.      Patient's diagnosis is consistent with contact dermatitis,  impetigo.  Patient presents emergency department with complaint of worsening rash.  Patient originally been seen diagnosed with contact dermatitis.  Due to miscommunication, patient did not realize that a prescription has been sent to his pharmacy.  He reports that the shot had almost cleared the rash until it returned.  He then took the prednisone taper which almost alleviated the rash until it returned.  At this time, I will give patient a shot of Decadron, steroid taper as well.  Patient has scratched the rash until he has excoriated it to the point of introducing a mild staph/impetigo infection.  Patient will also be covered with antibiotic for same.  Patient will be prescribed prednisone taper, triamcinolone topical, Keflex.  Follow-up with primary care as needed..  Patient is given ED precautions to return to the ED for any worsening or new symptoms.     ____________________________________________  FINAL CLINICAL IMPRESSION(S) / ED DIAGNOSES  Final diagnoses:  Contact dermatitis, unspecified contact dermatitis type, unspecified trigger  Impetigo      NEW MEDICATIONS STARTED DURING THIS VISIT:  ED Discharge Orders         Ordered    predniSONE (DELTASONE) 10 MG  tablet  Daily    Note to Pharmacy:  Take 6 pills x 2 days, 5 pills x 2 days, 4 pills x 2 days, 3 pills x 2 days, 2 pills x 2 days, and 1 pill x 2 days   06/19/18 1728    triamcinolone cream (KENALOG) 0.1 %  4 times daily     06/19/18 1728    cephALEXin (KEFLEX) 500 MG capsule  4 times daily     06/19/18 1728              This chart was dictated using voice recognition software/Dragon. Despite best efforts to proofread, errors can occur which can change the meaning. Any change was purely unintentional.    Racheal Patches, PA-C 06/19/18 1728    Minna Antis, MD 06/19/18 2204

## 2018-06-19 NOTE — ED Triage Notes (Signed)
Pt in via POV with complaints of intermittent rash x one month, seen here for same, reports rash responded well to prednisone treatment but has since returned.  Ambulatory to triage, NAD noted at this time.

## 2018-06-19 NOTE — ED Triage Notes (Signed)
FIRST NURSE NOTE-here for refill on prednisone.  Has rash to chest.  Took prednisone two weeks ago for same rash and went away but came back.

## 2018-06-19 NOTE — ED Notes (Addendum)
See triage note  Presents with rash to chest   States this has been going on for about 1 month  States he was given a shot here and was not aware of rx   So he did not pick that up as directed   So rash has returned

## 2019-10-18 DEATH — deceased

## 2020-02-07 IMAGING — CR DG CHEST 2V
2 series · 2 of 2 positions shown · non-contrast
Comparison: 11/19/2011

CLINICAL DATA: Hypertension with bradycardia.

EXAM:
CHEST - 2 VIEW

[chest pa]
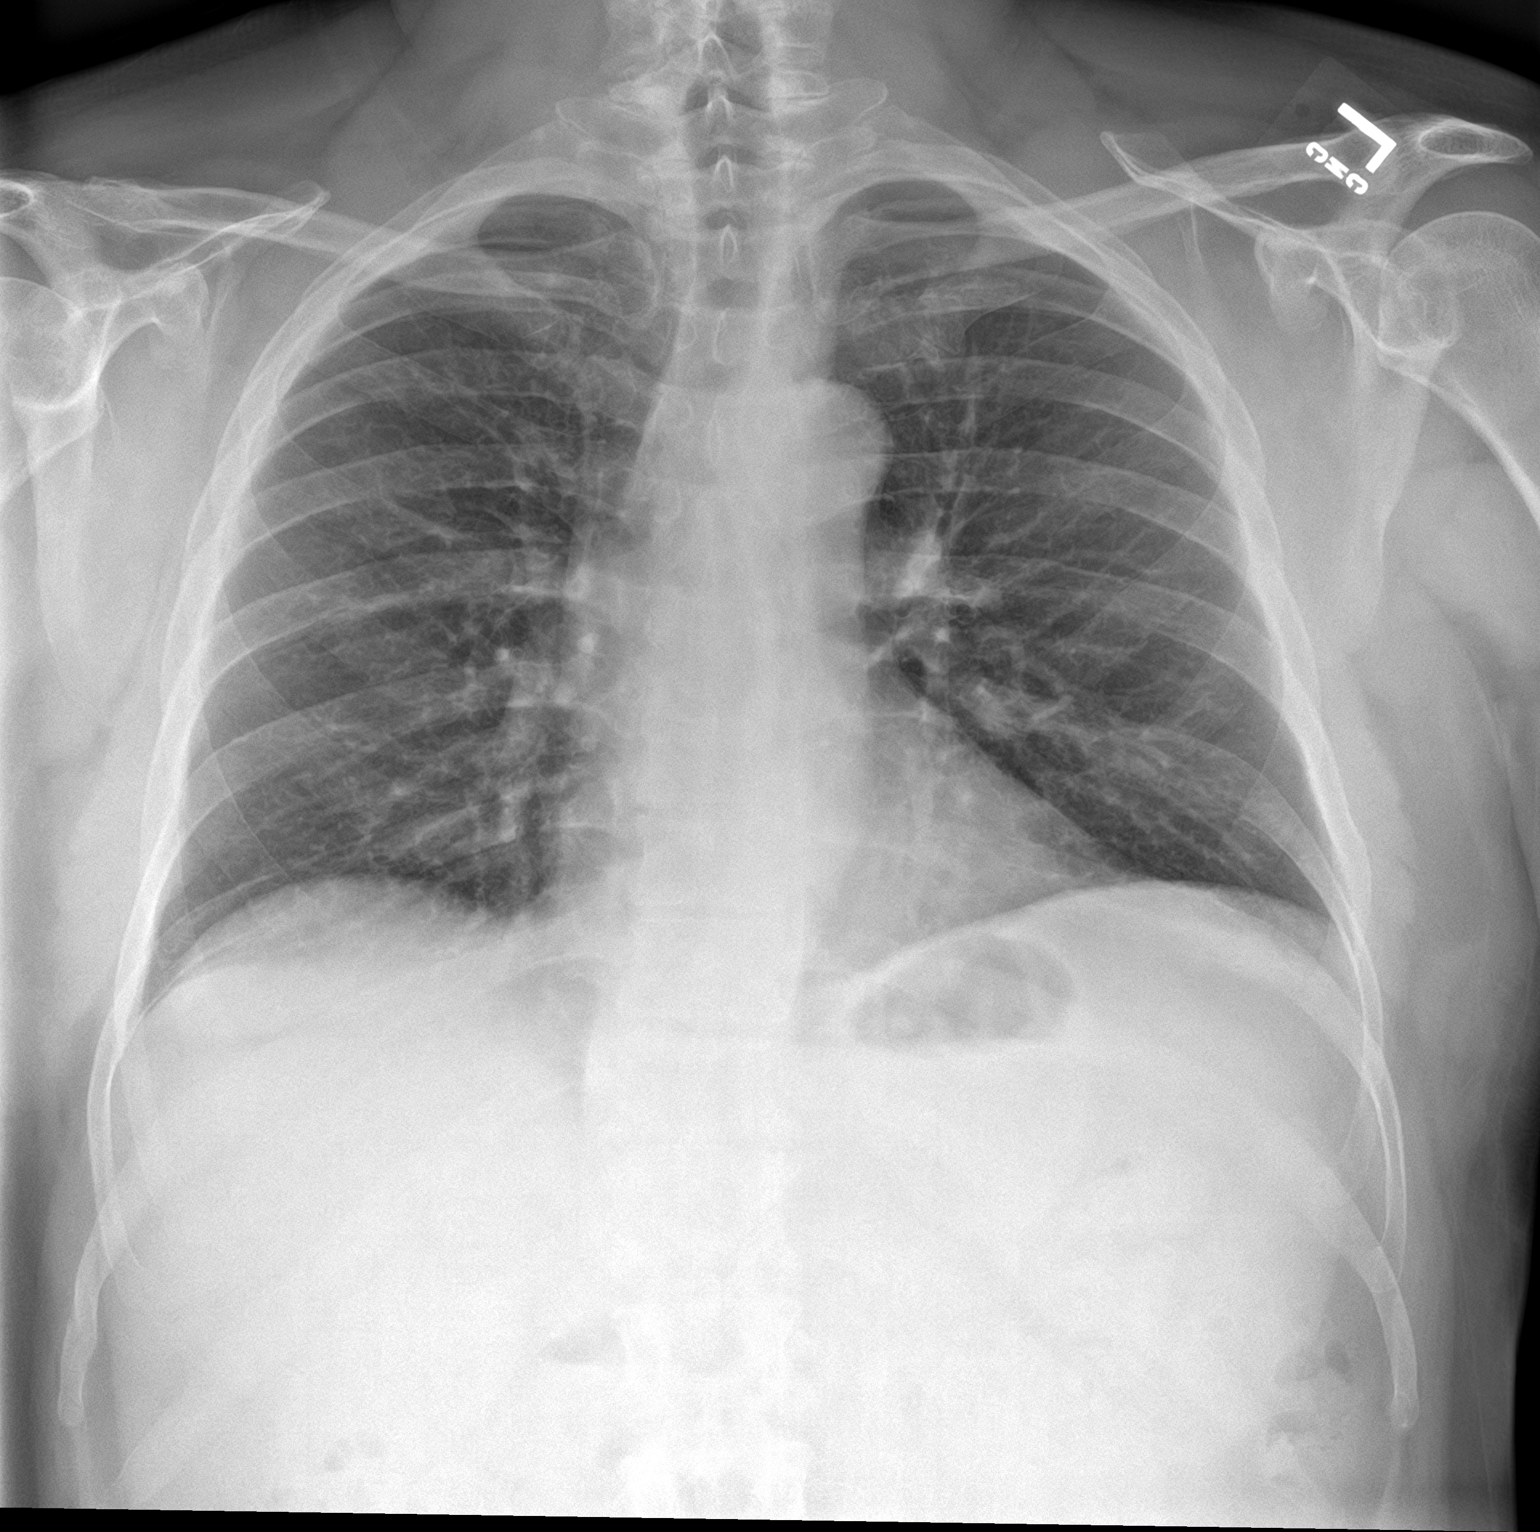

[chest lat]
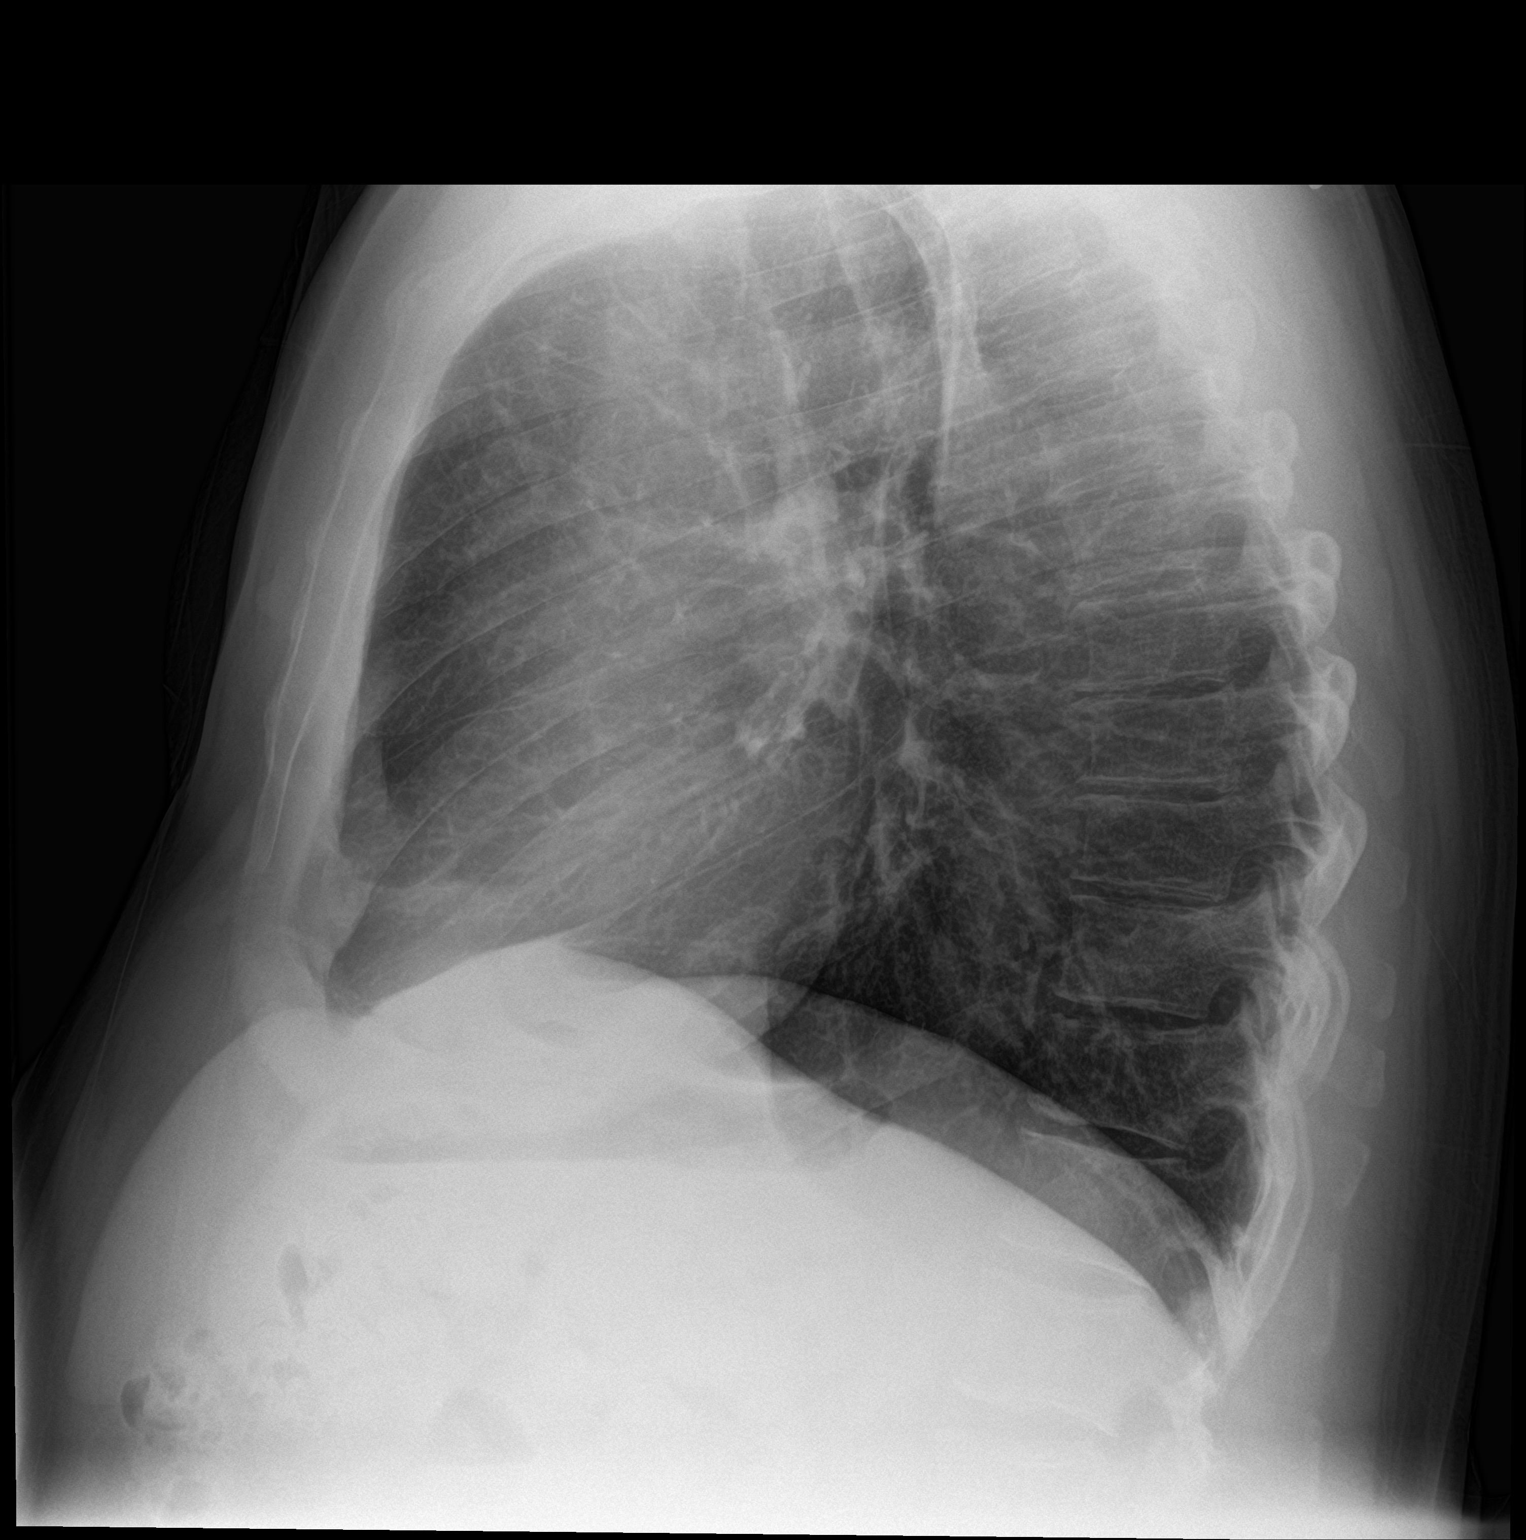

[2 of 2 positions shown; findings below may reference images not displayed]

FINDINGS: The heart size and mediastinal contours are within normal limits.
Both lungs are clear. The visualized skeletal structures are
unremarkable. No pleural effusions. Trachea is midline.
IMPRESSION: No active cardiopulmonary disease.
